# Patient Record
Sex: Female | Born: 1940 | Race: White | Hispanic: No
Health system: Southern US, Community
[De-identification: ages and names within clinical notes are randomized; demographics above are authoritative.]

---

## 1997-05-31 ENCOUNTER — Inpatient Hospital Stay (HOSPITAL_COMMUNITY): Admission: EM | Admit: 1997-05-31 | Discharge: 1997-06-04 | Payer: Self-pay | Admitting: Emergency Medicine

## 1997-09-03 ENCOUNTER — Other Ambulatory Visit: Admission: RE | Admit: 1997-09-03 | Discharge: 1997-09-03 | Payer: Self-pay

## 1997-12-07 ENCOUNTER — Other Ambulatory Visit: Admission: RE | Admit: 1997-12-07 | Discharge: 1997-12-07 | Payer: Self-pay

## 1999-01-03 ENCOUNTER — Ambulatory Visit (HOSPITAL_COMMUNITY): Admission: RE | Admit: 1999-01-03 | Discharge: 1999-01-03 | Payer: Self-pay | Admitting: Family Medicine

## 1999-01-25 ENCOUNTER — Ambulatory Visit (HOSPITAL_COMMUNITY): Admission: RE | Admit: 1999-01-25 | Discharge: 1999-01-25 | Payer: Self-pay | Admitting: Gastroenterology

## 1999-01-25 ENCOUNTER — Encounter: Payer: Self-pay | Admitting: Gastroenterology

## 1999-09-05 ENCOUNTER — Encounter: Payer: Self-pay | Admitting: Family Medicine

## 1999-09-05 ENCOUNTER — Encounter: Admission: RE | Admit: 1999-09-05 | Discharge: 1999-09-05 | Payer: Self-pay | Admitting: Family Medicine

## 1999-12-17 ENCOUNTER — Other Ambulatory Visit: Admission: RE | Admit: 1999-12-17 | Discharge: 1999-12-17 | Payer: Self-pay | Admitting: Family Medicine

## 1999-12-19 ENCOUNTER — Encounter: Admission: RE | Admit: 1999-12-19 | Discharge: 1999-12-19 | Payer: Self-pay | Admitting: Family Medicine

## 1999-12-19 ENCOUNTER — Encounter: Payer: Self-pay | Admitting: Family Medicine

## 2000-11-17 ENCOUNTER — Encounter: Admission: RE | Admit: 2000-11-17 | Discharge: 2000-11-17 | Payer: Self-pay | Admitting: Pediatrics

## 2000-11-17 ENCOUNTER — Encounter: Payer: Self-pay | Admitting: Family Medicine

## 2001-12-07 ENCOUNTER — Encounter: Admission: RE | Admit: 2001-12-07 | Discharge: 2001-12-07 | Payer: Self-pay | Admitting: Family Medicine

## 2001-12-07 ENCOUNTER — Encounter: Payer: Self-pay | Admitting: Family Medicine

## 2001-12-11 ENCOUNTER — Encounter: Payer: Self-pay | Admitting: Family Medicine

## 2001-12-11 ENCOUNTER — Encounter: Admission: RE | Admit: 2001-12-11 | Discharge: 2001-12-11 | Payer: Self-pay | Admitting: Family Medicine

## 2002-01-04 ENCOUNTER — Other Ambulatory Visit: Admission: RE | Admit: 2002-01-04 | Discharge: 2002-01-04 | Payer: Self-pay | Admitting: Family Medicine

## 2003-01-10 ENCOUNTER — Encounter: Admission: RE | Admit: 2003-01-10 | Discharge: 2003-01-10 | Payer: Self-pay | Admitting: Family Medicine

## 2003-03-07 ENCOUNTER — Other Ambulatory Visit: Admission: RE | Admit: 2003-03-07 | Discharge: 2003-03-07 | Payer: Self-pay | Admitting: Family Medicine

## 2004-02-22 ENCOUNTER — Encounter: Admission: RE | Admit: 2004-02-22 | Discharge: 2004-02-22 | Payer: Self-pay | Admitting: Family Medicine

## 2004-03-20 ENCOUNTER — Encounter (INDEPENDENT_AMBULATORY_CARE_PROVIDER_SITE_OTHER): Payer: Self-pay | Admitting: Cardiology

## 2004-03-20 ENCOUNTER — Ambulatory Visit (HOSPITAL_COMMUNITY): Admission: RE | Admit: 2004-03-20 | Discharge: 2004-03-20 | Payer: Self-pay | Admitting: Family Medicine

## 2004-05-22 ENCOUNTER — Ambulatory Visit (HOSPITAL_COMMUNITY): Admission: RE | Admit: 2004-05-22 | Discharge: 2004-05-22 | Payer: Self-pay | Admitting: Gastroenterology

## 2004-05-22 ENCOUNTER — Encounter (INDEPENDENT_AMBULATORY_CARE_PROVIDER_SITE_OTHER): Payer: Self-pay | Admitting: *Deleted

## 2005-02-28 ENCOUNTER — Encounter: Admission: RE | Admit: 2005-02-28 | Discharge: 2005-02-28 | Payer: Self-pay | Admitting: Family Medicine

## 2006-03-03 ENCOUNTER — Encounter: Admission: RE | Admit: 2006-03-03 | Discharge: 2006-03-03 | Payer: Self-pay | Admitting: Family Medicine

## 2007-03-06 ENCOUNTER — Encounter: Admission: RE | Admit: 2007-03-06 | Discharge: 2007-03-06 | Payer: Self-pay | Admitting: Family Medicine

## 2008-03-17 ENCOUNTER — Encounter: Admission: RE | Admit: 2008-03-17 | Discharge: 2008-03-17 | Payer: Self-pay | Admitting: Family Medicine

## 2009-03-21 ENCOUNTER — Encounter: Admission: RE | Admit: 2009-03-21 | Discharge: 2009-03-21 | Payer: Self-pay | Admitting: Family Medicine

## 2010-02-24 ENCOUNTER — Other Ambulatory Visit: Payer: Self-pay | Admitting: Family Medicine

## 2010-02-24 DIAGNOSIS — Z1239 Encounter for other screening for malignant neoplasm of breast: Secondary | ICD-10-CM

## 2010-03-23 ENCOUNTER — Ambulatory Visit
Admission: RE | Admit: 2010-03-23 | Discharge: 2010-03-23 | Disposition: A | Discharge status: Home / Self Care | Payer: MEDICARE | Source: Ambulatory Visit | Attending: Family Medicine | Admitting: Family Medicine

## 2010-03-23 DIAGNOSIS — Z1239 Encounter for other screening for malignant neoplasm of breast: Secondary | ICD-10-CM

## 2010-06-22 NOTE — Op Note (Signed)
NAMEJANIS, Dana Velasquez                ACCOUNT NO.:  [id number]   MEDICAL RECORD NO.:  [id number]          PATIENT TYPE:  AMB   LOCATION:  ENDO                         FACILITY:  [facility]   PHYSICIAN:  Petra Kuba, M.D.    DATE OF BIRTH:  11-21-1940   DATE OF PROCEDURE:  05/22/2004  DATE OF DISCHARGE:                                 OPERATIVE REPORT   PROCEDURE:  Colonoscopy.   INDICATIONS:  Bright red blood per rectum, probably due to hemorrhoids.  Patient due for colonic screening. Consent was signed after risks, benefits,  methods, options thoroughly discussed in the office.   MEDICINES USED:  Demerol 70, Versed 7   PROCEDURE:  Rectal inspection is pertinent for external hemorrhoids, small.  Digital exam was negative. Video pediatric adjustable colonoscope was  inserted, and despite some tortuosity, we were able to be advance to the  cecum. This did not require any abdominal pressure any position changes.  Left and right scattered diverticula were seen. No other abnormalities.  Cecum was identified by the appendiceal orifice and ileocecal valve. The  scope was inserted short ways in the terminal ileum which was normal. Photo  documentation was obtained. Scope was slowly withdrawn. On slow withdrawal  through the colon, the prep was adequate. There was some liquid stool that  required washing and suctioning. The scattered left and right diverticula  were confirmed. No other abnormalities were seen as we slowly withdrew back  to the rectum. In the rectum, a small rectal polyp was seen and snared,  electrocautery applied, suctioned through the scope and collected in the  trap. Anorectal pull-through and retroflexion confirmed some small  hemorrhoids. Scope was straightened re-advanced short ways up the left side  of the colon. Air was suctioned, scope removed. The patient tolerated the  procedure well. There was no obvious immediate complication.   ENDOSCOPIC DIAGNOSES:  1.   Internal-external small hemorrhoids.  2.  Small rectal polyp snared.  3.  Left and right scattered diverticula.  4.  Otherwise within normal limits to the terminal ileum.   PLAN:  Await pathology. Probably recheck in 5 years. Happy to see back  p.r.n. Otherwise return care to Dr. _______________ for customary health  care maintenance to include yearly rectals and guaiacs.      MEM/MEDQ  D:  05/22/2004  T:  05/22/2004  Job:  [id number]

## 2011-02-26 ENCOUNTER — Other Ambulatory Visit: Payer: Self-pay | Admitting: Family Medicine

## 2011-02-26 DIAGNOSIS — Z1231 Encounter for screening mammogram for malignant neoplasm of breast: Secondary | ICD-10-CM

## 2011-03-25 ENCOUNTER — Ambulatory Visit
Admission: RE | Admit: 2011-03-25 | Discharge: 2011-03-25 | Disposition: A | Discharge status: Home / Self Care | Payer: Medicare Other | Source: Ambulatory Visit | Attending: Family Medicine | Admitting: Family Medicine

## 2011-03-25 DIAGNOSIS — Z1231 Encounter for screening mammogram for malignant neoplasm of breast: Secondary | ICD-10-CM

## 2011-04-17 ENCOUNTER — Other Ambulatory Visit: Payer: Self-pay | Admitting: Family Medicine

## 2011-04-17 DIAGNOSIS — Z780 Asymptomatic menopausal state: Secondary | ICD-10-CM

## 2011-05-08 ENCOUNTER — Ambulatory Visit
Admission: RE | Admit: 2011-05-08 | Discharge: 2011-05-08 | Disposition: A | Discharge status: Home / Self Care | Payer: Medicare Other | Source: Ambulatory Visit | Attending: Family Medicine | Admitting: Family Medicine

## 2011-05-08 DIAGNOSIS — Z780 Asymptomatic menopausal state: Secondary | ICD-10-CM

## 2012-02-26 ENCOUNTER — Other Ambulatory Visit: Payer: Self-pay | Admitting: Family Medicine

## 2012-02-26 DIAGNOSIS — Z1231 Encounter for screening mammogram for malignant neoplasm of breast: Secondary | ICD-10-CM

## 2012-03-25 ENCOUNTER — Ambulatory Visit
Admission: RE | Admit: 2012-03-25 | Discharge: 2012-03-25 | Disposition: A | Discharge status: Home / Self Care | Payer: Medicare Other | Source: Ambulatory Visit | Attending: Family Medicine | Admitting: Family Medicine

## 2012-03-25 DIAGNOSIS — Z1231 Encounter for screening mammogram for malignant neoplasm of breast: Secondary | ICD-10-CM

## 2013-03-03 ENCOUNTER — Other Ambulatory Visit: Payer: Self-pay

## 2013-03-03 DIAGNOSIS — Z1231 Encounter for screening mammogram for malignant neoplasm of breast: Secondary | ICD-10-CM

## 2013-03-26 ENCOUNTER — Ambulatory Visit: Payer: Medicare Other

## 2013-04-14 ENCOUNTER — Ambulatory Visit
Admission: RE | Admit: 2013-04-14 | Discharge: 2013-04-14 | Disposition: A | Discharge status: Home / Self Care | Payer: Medicare Other | Source: Ambulatory Visit

## 2013-04-14 DIAGNOSIS — Z1231 Encounter for screening mammogram for malignant neoplasm of breast: Secondary | ICD-10-CM

## 2013-04-15 ENCOUNTER — Other Ambulatory Visit: Payer: Self-pay | Admitting: Family Medicine

## 2013-04-15 DIAGNOSIS — R928 Other abnormal and inconclusive findings on diagnostic imaging of breast: Secondary | ICD-10-CM

## 2013-04-19 ENCOUNTER — Other Ambulatory Visit: Payer: Self-pay | Admitting: Family Medicine

## 2013-04-19 DIAGNOSIS — M810 Age-related osteoporosis without current pathological fracture: Secondary | ICD-10-CM

## 2013-04-19 DIAGNOSIS — E2839 Other primary ovarian failure: Secondary | ICD-10-CM

## 2013-04-27 ENCOUNTER — Ambulatory Visit
Admission: RE | Admit: 2013-04-27 | Discharge: 2013-04-27 | Disposition: A | Discharge status: Home / Self Care | Payer: Medicare Other | Source: Ambulatory Visit | Attending: Family Medicine | Admitting: Family Medicine

## 2013-04-27 ENCOUNTER — Other Ambulatory Visit: Payer: Self-pay | Admitting: Family Medicine

## 2013-04-27 DIAGNOSIS — R928 Other abnormal and inconclusive findings on diagnostic imaging of breast: Secondary | ICD-10-CM

## 2013-05-10 ENCOUNTER — Other Ambulatory Visit: Payer: Medicare Other

## 2013-05-10 ENCOUNTER — Other Ambulatory Visit: Payer: Self-pay | Admitting: Family Medicine

## 2013-05-12 ENCOUNTER — Ambulatory Visit
Admission: RE | Admit: 2013-05-12 | Discharge: 2013-05-12 | Disposition: A | Discharge status: Home / Self Care | Payer: Medicare Other | Source: Ambulatory Visit | Attending: Family Medicine | Admitting: Family Medicine

## 2013-05-12 DIAGNOSIS — E2839 Other primary ovarian failure: Secondary | ICD-10-CM

## 2013-09-21 ENCOUNTER — Other Ambulatory Visit: Payer: Self-pay | Admitting: Family Medicine

## 2013-09-21 DIAGNOSIS — N6310 Unspecified lump in the right breast, unspecified quadrant: Secondary | ICD-10-CM

## 2013-09-30 ENCOUNTER — Ambulatory Visit
Admission: RE | Admit: 2013-09-30 | Discharge: 2013-09-30 | Disposition: A | Discharge status: Home / Self Care | Payer: Medicare Other | Source: Ambulatory Visit | Attending: Family Medicine | Admitting: Family Medicine

## 2013-09-30 DIAGNOSIS — N6310 Unspecified lump in the right breast, unspecified quadrant: Secondary | ICD-10-CM

## 2014-03-29 ENCOUNTER — Other Ambulatory Visit: Payer: Self-pay

## 2014-03-29 DIAGNOSIS — Z1231 Encounter for screening mammogram for malignant neoplasm of breast: Secondary | ICD-10-CM

## 2014-04-21 ENCOUNTER — Ambulatory Visit
Admission: RE | Admit: 2014-04-21 | Discharge: 2014-04-21 | Disposition: A | Discharge status: Home / Self Care | Payer: Medicare Other | Source: Ambulatory Visit

## 2014-04-21 DIAGNOSIS — Z1231 Encounter for screening mammogram for malignant neoplasm of breast: Secondary | ICD-10-CM

## 2014-04-27 DIAGNOSIS — E781 Pure hyperglyceridemia: Secondary | ICD-10-CM | POA: Diagnosis not present

## 2014-04-27 DIAGNOSIS — Z1389 Encounter for screening for other disorder: Secondary | ICD-10-CM | POA: Diagnosis not present

## 2014-04-27 DIAGNOSIS — Z9181 History of falling: Secondary | ICD-10-CM | POA: Diagnosis not present

## 2014-04-27 DIAGNOSIS — E039 Hypothyroidism, unspecified: Secondary | ICD-10-CM | POA: Diagnosis not present

## 2014-04-27 DIAGNOSIS — R7309 Other abnormal glucose: Secondary | ICD-10-CM | POA: Diagnosis not present

## 2014-04-27 DIAGNOSIS — Z0000 Encounter for general adult medical examination without abnormal findings: Secondary | ICD-10-CM | POA: Diagnosis not present

## 2014-11-01 DIAGNOSIS — E781 Pure hyperglyceridemia: Secondary | ICD-10-CM | POA: Diagnosis not present

## 2014-11-01 DIAGNOSIS — R7301 Impaired fasting glucose: Secondary | ICD-10-CM | POA: Diagnosis not present

## 2014-11-01 DIAGNOSIS — Z139 Encounter for screening, unspecified: Secondary | ICD-10-CM | POA: Diagnosis not present

## 2014-11-01 DIAGNOSIS — Z1389 Encounter for screening for other disorder: Secondary | ICD-10-CM | POA: Diagnosis not present

## 2014-11-01 DIAGNOSIS — Z9181 History of falling: Secondary | ICD-10-CM | POA: Diagnosis not present

## 2014-11-01 DIAGNOSIS — R7309 Other abnormal glucose: Secondary | ICD-10-CM | POA: Diagnosis not present

## 2014-11-01 DIAGNOSIS — E039 Hypothyroidism, unspecified: Secondary | ICD-10-CM | POA: Diagnosis not present

## 2014-11-01 DIAGNOSIS — B009 Herpesviral infection, unspecified: Secondary | ICD-10-CM | POA: Diagnosis not present

## 2014-11-09 ENCOUNTER — Other Ambulatory Visit: Payer: Self-pay | Admitting: Gastroenterology

## 2014-11-09 DIAGNOSIS — K5730 Diverticulosis of large intestine without perforation or abscess without bleeding: Secondary | ICD-10-CM | POA: Diagnosis not present

## 2014-11-09 DIAGNOSIS — D120 Benign neoplasm of cecum: Secondary | ICD-10-CM | POA: Diagnosis not present

## 2014-11-09 DIAGNOSIS — Z09 Encounter for follow-up examination after completed treatment for conditions other than malignant neoplasm: Secondary | ICD-10-CM | POA: Diagnosis not present

## 2014-11-09 DIAGNOSIS — D126 Benign neoplasm of colon, unspecified: Secondary | ICD-10-CM | POA: Diagnosis not present

## 2014-11-09 DIAGNOSIS — D122 Benign neoplasm of ascending colon: Secondary | ICD-10-CM | POA: Diagnosis not present

## 2014-11-09 DIAGNOSIS — D124 Benign neoplasm of descending colon: Secondary | ICD-10-CM | POA: Diagnosis not present

## 2014-11-09 DIAGNOSIS — Z86010 Personal history of colonic polyps: Secondary | ICD-10-CM | POA: Diagnosis not present

## 2014-11-29 DIAGNOSIS — Z23 Encounter for immunization: Secondary | ICD-10-CM | POA: Diagnosis not present

## 2014-12-07 DIAGNOSIS — H2513 Age-related nuclear cataract, bilateral: Secondary | ICD-10-CM | POA: Diagnosis not present

## 2014-12-07 DIAGNOSIS — H10413 Chronic giant papillary conjunctivitis, bilateral: Secondary | ICD-10-CM | POA: Diagnosis not present

## 2014-12-08 DIAGNOSIS — E039 Hypothyroidism, unspecified: Secondary | ICD-10-CM | POA: Diagnosis not present

## 2015-01-25 DIAGNOSIS — E039 Hypothyroidism, unspecified: Secondary | ICD-10-CM | POA: Diagnosis not present

## 2015-04-03 ENCOUNTER — Other Ambulatory Visit: Payer: Self-pay

## 2015-04-03 DIAGNOSIS — Z1231 Encounter for screening mammogram for malignant neoplasm of breast: Secondary | ICD-10-CM

## 2015-05-01 DIAGNOSIS — E781 Pure hyperglyceridemia: Secondary | ICD-10-CM | POA: Diagnosis not present

## 2015-05-01 DIAGNOSIS — R7303 Prediabetes: Secondary | ICD-10-CM | POA: Diagnosis not present

## 2015-05-01 DIAGNOSIS — E039 Hypothyroidism, unspecified: Secondary | ICD-10-CM | POA: Diagnosis not present

## 2015-05-01 DIAGNOSIS — I4891 Unspecified atrial fibrillation: Secondary | ICD-10-CM | POA: Diagnosis not present

## 2015-05-02 ENCOUNTER — Ambulatory Visit
Admission: RE | Admit: 2015-05-02 | Discharge: 2015-05-02 | Disposition: A | Discharge status: Home / Self Care | Payer: Medicare Other | Source: Ambulatory Visit

## 2015-05-02 DIAGNOSIS — Z1231 Encounter for screening mammogram for malignant neoplasm of breast: Secondary | ICD-10-CM | POA: Diagnosis not present

## 2015-06-27 DIAGNOSIS — M542 Cervicalgia: Secondary | ICD-10-CM | POA: Diagnosis not present

## 2015-06-27 DIAGNOSIS — J189 Pneumonia, unspecified organism: Secondary | ICD-10-CM | POA: Diagnosis not present

## 2015-11-10 DIAGNOSIS — Z23 Encounter for immunization: Secondary | ICD-10-CM | POA: Diagnosis not present

## 2015-11-10 DIAGNOSIS — E781 Pure hyperglyceridemia: Secondary | ICD-10-CM | POA: Diagnosis not present

## 2015-11-10 DIAGNOSIS — Z1389 Encounter for screening for other disorder: Secondary | ICD-10-CM | POA: Diagnosis not present

## 2015-11-10 DIAGNOSIS — R7303 Prediabetes: Secondary | ICD-10-CM | POA: Diagnosis not present

## 2015-11-10 DIAGNOSIS — I480 Paroxysmal atrial fibrillation: Secondary | ICD-10-CM | POA: Diagnosis not present

## 2015-11-10 DIAGNOSIS — E039 Hypothyroidism, unspecified: Secondary | ICD-10-CM | POA: Diagnosis not present

## 2015-11-10 DIAGNOSIS — Z79899 Other long term (current) drug therapy: Secondary | ICD-10-CM | POA: Diagnosis not present

## 2015-11-10 DIAGNOSIS — Z9181 History of falling: Secondary | ICD-10-CM | POA: Diagnosis not present

## 2016-05-15 ENCOUNTER — Other Ambulatory Visit: Payer: Self-pay | Admitting: Physician Assistant

## 2016-05-15 DIAGNOSIS — Z1231 Encounter for screening mammogram for malignant neoplasm of breast: Secondary | ICD-10-CM

## 2016-05-15 DIAGNOSIS — E2839 Other primary ovarian failure: Secondary | ICD-10-CM

## 2016-06-12 ENCOUNTER — Ambulatory Visit: Payer: Self-pay

## 2016-06-12 ENCOUNTER — Other Ambulatory Visit: Payer: Self-pay

## 2016-06-13 ENCOUNTER — Ambulatory Visit
Admission: RE | Admit: 2016-06-13 | Discharge: 2016-06-13 | Disposition: A | Discharge status: Home / Self Care | Payer: Medicare Other | Source: Ambulatory Visit | Attending: Physician Assistant | Admitting: Physician Assistant

## 2016-06-13 DIAGNOSIS — Z1231 Encounter for screening mammogram for malignant neoplasm of breast: Secondary | ICD-10-CM

## 2016-06-13 DIAGNOSIS — E2839 Other primary ovarian failure: Secondary | ICD-10-CM

## 2017-05-07 ENCOUNTER — Other Ambulatory Visit: Payer: Self-pay | Admitting: Physician Assistant

## 2017-05-07 DIAGNOSIS — Z1231 Encounter for screening mammogram for malignant neoplasm of breast: Secondary | ICD-10-CM

## 2017-06-10 DIAGNOSIS — G4733 Obstructive sleep apnea (adult) (pediatric): Secondary | ICD-10-CM | POA: Diagnosis not present

## 2017-06-10 DIAGNOSIS — R0602 Shortness of breath: Secondary | ICD-10-CM | POA: Diagnosis not present

## 2017-06-11 DIAGNOSIS — G4733 Obstructive sleep apnea (adult) (pediatric): Secondary | ICD-10-CM | POA: Diagnosis not present

## 2017-06-11 DIAGNOSIS — R0602 Shortness of breath: Secondary | ICD-10-CM | POA: Diagnosis not present

## 2017-06-18 ENCOUNTER — Ambulatory Visit
Admission: RE | Admit: 2017-06-18 | Discharge: 2017-06-18 | Disposition: A | Discharge status: Home / Self Care | Payer: Medicare Other | Source: Ambulatory Visit | Attending: Physician Assistant | Admitting: Physician Assistant

## 2017-06-18 DIAGNOSIS — Z1231 Encounter for screening mammogram for malignant neoplasm of breast: Secondary | ICD-10-CM | POA: Diagnosis not present

## 2017-07-30 DIAGNOSIS — G4733 Obstructive sleep apnea (adult) (pediatric): Secondary | ICD-10-CM | POA: Diagnosis not present

## 2017-08-29 DIAGNOSIS — G4733 Obstructive sleep apnea (adult) (pediatric): Secondary | ICD-10-CM | POA: Diagnosis not present

## 2017-09-29 DIAGNOSIS — G4733 Obstructive sleep apnea (adult) (pediatric): Secondary | ICD-10-CM | POA: Diagnosis not present

## 2017-10-02 DIAGNOSIS — Z9181 History of falling: Secondary | ICD-10-CM | POA: Diagnosis not present

## 2017-10-02 DIAGNOSIS — Z139 Encounter for screening, unspecified: Secondary | ICD-10-CM | POA: Diagnosis not present

## 2017-10-02 DIAGNOSIS — Z0000 Encounter for general adult medical examination without abnormal findings: Secondary | ICD-10-CM | POA: Diagnosis not present

## 2017-10-02 DIAGNOSIS — Z136 Encounter for screening for cardiovascular disorders: Secondary | ICD-10-CM | POA: Diagnosis not present

## 2017-10-30 DIAGNOSIS — G4733 Obstructive sleep apnea (adult) (pediatric): Secondary | ICD-10-CM | POA: Diagnosis not present

## 2017-11-19 DIAGNOSIS — R7303 Prediabetes: Secondary | ICD-10-CM | POA: Diagnosis not present

## 2017-11-19 DIAGNOSIS — E039 Hypothyroidism, unspecified: Secondary | ICD-10-CM | POA: Diagnosis not present

## 2017-11-19 DIAGNOSIS — Z79899 Other long term (current) drug therapy: Secondary | ICD-10-CM | POA: Diagnosis not present

## 2017-11-19 DIAGNOSIS — E781 Pure hyperglyceridemia: Secondary | ICD-10-CM | POA: Diagnosis not present

## 2017-11-19 DIAGNOSIS — Z23 Encounter for immunization: Secondary | ICD-10-CM | POA: Diagnosis not present

## 2017-11-29 DIAGNOSIS — G4733 Obstructive sleep apnea (adult) (pediatric): Secondary | ICD-10-CM | POA: Diagnosis not present

## 2017-12-18 DIAGNOSIS — H17822 Peripheral opacity of cornea, left eye: Secondary | ICD-10-CM | POA: Diagnosis not present

## 2017-12-18 DIAGNOSIS — H40011 Open angle with borderline findings, low risk, right eye: Secondary | ICD-10-CM | POA: Diagnosis not present

## 2017-12-18 DIAGNOSIS — H10413 Chronic giant papillary conjunctivitis, bilateral: Secondary | ICD-10-CM | POA: Diagnosis not present

## 2017-12-18 DIAGNOSIS — H2513 Age-related nuclear cataract, bilateral: Secondary | ICD-10-CM | POA: Diagnosis not present

## 2017-12-30 DIAGNOSIS — G4733 Obstructive sleep apnea (adult) (pediatric): Secondary | ICD-10-CM | POA: Diagnosis not present

## 2018-01-29 DIAGNOSIS — G4733 Obstructive sleep apnea (adult) (pediatric): Secondary | ICD-10-CM | POA: Diagnosis not present

## 2018-03-01 DIAGNOSIS — G4733 Obstructive sleep apnea (adult) (pediatric): Secondary | ICD-10-CM | POA: Diagnosis not present

## 2018-03-10 DIAGNOSIS — G4733 Obstructive sleep apnea (adult) (pediatric): Secondary | ICD-10-CM | POA: Diagnosis not present

## 2018-04-01 DIAGNOSIS — G4733 Obstructive sleep apnea (adult) (pediatric): Secondary | ICD-10-CM | POA: Diagnosis not present

## 2018-07-08 ENCOUNTER — Other Ambulatory Visit: Payer: Self-pay | Admitting: Physician Assistant

## 2018-07-08 DIAGNOSIS — Z1231 Encounter for screening mammogram for malignant neoplasm of breast: Secondary | ICD-10-CM

## 2018-08-25 ENCOUNTER — Ambulatory Visit
Admission: RE | Admit: 2018-08-25 | Discharge: 2018-08-25 | Disposition: A | Discharge status: Home / Self Care | Payer: Medicare Other | Source: Ambulatory Visit | Attending: Physician Assistant | Admitting: Physician Assistant

## 2018-08-25 ENCOUNTER — Other Ambulatory Visit: Payer: Self-pay

## 2018-08-25 DIAGNOSIS — Z1231 Encounter for screening mammogram for malignant neoplasm of breast: Secondary | ICD-10-CM

## 2019-07-23 ENCOUNTER — Other Ambulatory Visit: Payer: Self-pay | Admitting: Physician Assistant

## 2019-07-23 DIAGNOSIS — Z1231 Encounter for screening mammogram for malignant neoplasm of breast: Secondary | ICD-10-CM

## 2019-08-05 DIAGNOSIS — G4733 Obstructive sleep apnea (adult) (pediatric): Secondary | ICD-10-CM | POA: Diagnosis not present

## 2019-08-05 DIAGNOSIS — I4821 Permanent atrial fibrillation: Secondary | ICD-10-CM | POA: Diagnosis not present

## 2019-08-05 DIAGNOSIS — R5383 Other fatigue: Secondary | ICD-10-CM | POA: Diagnosis not present

## 2019-08-10 DIAGNOSIS — E039 Hypothyroidism, unspecified: Secondary | ICD-10-CM | POA: Diagnosis not present

## 2019-08-10 DIAGNOSIS — R7303 Prediabetes: Secondary | ICD-10-CM | POA: Diagnosis not present

## 2019-08-10 DIAGNOSIS — E781 Pure hyperglyceridemia: Secondary | ICD-10-CM | POA: Diagnosis not present

## 2019-08-10 DIAGNOSIS — I480 Paroxysmal atrial fibrillation: Secondary | ICD-10-CM | POA: Diagnosis not present

## 2019-08-10 DIAGNOSIS — Z79899 Other long term (current) drug therapy: Secondary | ICD-10-CM | POA: Diagnosis not present

## 2019-08-26 ENCOUNTER — Ambulatory Visit
Admission: RE | Admit: 2019-08-26 | Discharge: 2019-08-26 | Disposition: A | Discharge status: Home / Self Care | Payer: Medicare Other | Source: Ambulatory Visit | Attending: Physician Assistant | Admitting: Physician Assistant

## 2019-08-26 ENCOUNTER — Other Ambulatory Visit: Payer: Self-pay

## 2019-08-26 DIAGNOSIS — Z1231 Encounter for screening mammogram for malignant neoplasm of breast: Secondary | ICD-10-CM | POA: Diagnosis not present

## 2019-10-20 DIAGNOSIS — Z7901 Long term (current) use of anticoagulants: Secondary | ICD-10-CM | POA: Diagnosis not present

## 2019-10-20 DIAGNOSIS — I341 Nonrheumatic mitral (valve) prolapse: Secondary | ICD-10-CM | POA: Diagnosis not present

## 2019-10-20 DIAGNOSIS — I340 Nonrheumatic mitral (valve) insufficiency: Secondary | ICD-10-CM | POA: Diagnosis not present

## 2019-10-20 DIAGNOSIS — I493 Ventricular premature depolarization: Secondary | ICD-10-CM | POA: Diagnosis not present

## 2019-10-20 DIAGNOSIS — I4821 Permanent atrial fibrillation: Secondary | ICD-10-CM | POA: Diagnosis not present

## 2019-10-29 DIAGNOSIS — G4733 Obstructive sleep apnea (adult) (pediatric): Secondary | ICD-10-CM | POA: Diagnosis not present

## 2019-11-25 DIAGNOSIS — I4821 Permanent atrial fibrillation: Secondary | ICD-10-CM | POA: Diagnosis not present

## 2019-11-25 DIAGNOSIS — G4733 Obstructive sleep apnea (adult) (pediatric): Secondary | ICD-10-CM | POA: Diagnosis not present

## 2019-11-25 DIAGNOSIS — R0602 Shortness of breath: Secondary | ICD-10-CM | POA: Diagnosis not present

## 2019-11-25 DIAGNOSIS — R5383 Other fatigue: Secondary | ICD-10-CM | POA: Diagnosis not present

## 2019-12-14 DIAGNOSIS — Z79899 Other long term (current) drug therapy: Secondary | ICD-10-CM | POA: Diagnosis not present

## 2019-12-14 DIAGNOSIS — I480 Paroxysmal atrial fibrillation: Secondary | ICD-10-CM | POA: Diagnosis not present

## 2019-12-14 DIAGNOSIS — E039 Hypothyroidism, unspecified: Secondary | ICD-10-CM | POA: Diagnosis not present

## 2019-12-14 DIAGNOSIS — R7303 Prediabetes: Secondary | ICD-10-CM | POA: Diagnosis not present

## 2019-12-14 DIAGNOSIS — Z139 Encounter for screening, unspecified: Secondary | ICD-10-CM | POA: Diagnosis not present

## 2019-12-14 DIAGNOSIS — Z9181 History of falling: Secondary | ICD-10-CM | POA: Diagnosis not present

## 2019-12-14 DIAGNOSIS — E781 Pure hyperglyceridemia: Secondary | ICD-10-CM | POA: Diagnosis not present

## 2020-01-06 DIAGNOSIS — H2513 Age-related nuclear cataract, bilateral: Secondary | ICD-10-CM | POA: Diagnosis not present

## 2020-01-06 DIAGNOSIS — R7309 Other abnormal glucose: Secondary | ICD-10-CM | POA: Diagnosis not present

## 2020-01-06 DIAGNOSIS — H179 Unspecified corneal scar and opacity: Secondary | ICD-10-CM | POA: Diagnosis not present

## 2020-01-06 DIAGNOSIS — H40011 Open angle with borderline findings, low risk, right eye: Secondary | ICD-10-CM | POA: Diagnosis not present

## 2020-01-20 DIAGNOSIS — G4733 Obstructive sleep apnea (adult) (pediatric): Secondary | ICD-10-CM | POA: Diagnosis not present

## 2020-01-20 DIAGNOSIS — R5383 Other fatigue: Secondary | ICD-10-CM | POA: Diagnosis not present

## 2020-01-20 DIAGNOSIS — I4821 Permanent atrial fibrillation: Secondary | ICD-10-CM | POA: Diagnosis not present

## 2020-02-02 DIAGNOSIS — G4733 Obstructive sleep apnea (adult) (pediatric): Secondary | ICD-10-CM | POA: Diagnosis not present

## 2020-03-16 DIAGNOSIS — Z0000 Encounter for general adult medical examination without abnormal findings: Secondary | ICD-10-CM | POA: Diagnosis not present

## 2020-03-16 DIAGNOSIS — Z9181 History of falling: Secondary | ICD-10-CM | POA: Diagnosis not present

## 2020-04-14 DIAGNOSIS — E781 Pure hyperglyceridemia: Secondary | ICD-10-CM | POA: Diagnosis not present

## 2020-04-14 DIAGNOSIS — Z79899 Other long term (current) drug therapy: Secondary | ICD-10-CM | POA: Diagnosis not present

## 2020-04-14 DIAGNOSIS — R202 Paresthesia of skin: Secondary | ICD-10-CM | POA: Diagnosis not present

## 2020-04-14 DIAGNOSIS — I480 Paroxysmal atrial fibrillation: Secondary | ICD-10-CM | POA: Diagnosis not present

## 2020-04-14 DIAGNOSIS — E039 Hypothyroidism, unspecified: Secondary | ICD-10-CM | POA: Diagnosis not present

## 2020-04-14 DIAGNOSIS — G4733 Obstructive sleep apnea (adult) (pediatric): Secondary | ICD-10-CM | POA: Diagnosis not present

## 2020-04-14 DIAGNOSIS — R7303 Prediabetes: Secondary | ICD-10-CM | POA: Diagnosis not present

## 2020-04-20 DIAGNOSIS — H02886 Meibomian gland dysfunction of left eye, unspecified eyelid: Secondary | ICD-10-CM | POA: Diagnosis not present

## 2020-04-20 DIAGNOSIS — H02883 Meibomian gland dysfunction of right eye, unspecified eyelid: Secondary | ICD-10-CM | POA: Diagnosis not present

## 2020-04-21 DIAGNOSIS — I272 Pulmonary hypertension, unspecified: Secondary | ICD-10-CM | POA: Diagnosis not present

## 2020-04-21 DIAGNOSIS — Z9989 Dependence on other enabling machines and devices: Secondary | ICD-10-CM | POA: Diagnosis not present

## 2020-04-21 DIAGNOSIS — I4821 Permanent atrial fibrillation: Secondary | ICD-10-CM | POA: Diagnosis not present

## 2020-04-21 DIAGNOSIS — Z7901 Long term (current) use of anticoagulants: Secondary | ICD-10-CM | POA: Diagnosis not present

## 2020-04-21 DIAGNOSIS — G4733 Obstructive sleep apnea (adult) (pediatric): Secondary | ICD-10-CM | POA: Diagnosis not present

## 2020-05-04 DIAGNOSIS — G4733 Obstructive sleep apnea (adult) (pediatric): Secondary | ICD-10-CM | POA: Diagnosis not present

## 2020-05-22 DIAGNOSIS — H02883 Meibomian gland dysfunction of right eye, unspecified eyelid: Secondary | ICD-10-CM | POA: Diagnosis not present

## 2020-05-22 DIAGNOSIS — H02886 Meibomian gland dysfunction of left eye, unspecified eyelid: Secondary | ICD-10-CM | POA: Diagnosis not present

## 2020-06-15 DIAGNOSIS — K5730 Diverticulosis of large intestine without perforation or abscess without bleeding: Secondary | ICD-10-CM | POA: Diagnosis not present

## 2020-07-17 DIAGNOSIS — E118 Type 2 diabetes mellitus with unspecified complications: Secondary | ICD-10-CM | POA: Diagnosis not present

## 2020-07-17 DIAGNOSIS — E781 Pure hyperglyceridemia: Secondary | ICD-10-CM | POA: Diagnosis not present

## 2020-07-17 DIAGNOSIS — I480 Paroxysmal atrial fibrillation: Secondary | ICD-10-CM | POA: Diagnosis not present

## 2020-07-17 DIAGNOSIS — Z79899 Other long term (current) drug therapy: Secondary | ICD-10-CM | POA: Diagnosis not present

## 2020-07-17 DIAGNOSIS — E039 Hypothyroidism, unspecified: Secondary | ICD-10-CM | POA: Diagnosis not present

## 2020-07-17 DIAGNOSIS — G4733 Obstructive sleep apnea (adult) (pediatric): Secondary | ICD-10-CM | POA: Diagnosis not present

## 2020-07-18 ENCOUNTER — Other Ambulatory Visit: Payer: Self-pay | Admitting: Physician Assistant

## 2020-07-18 DIAGNOSIS — E2839 Other primary ovarian failure: Secondary | ICD-10-CM

## 2020-07-18 DIAGNOSIS — Z1231 Encounter for screening mammogram for malignant neoplasm of breast: Secondary | ICD-10-CM

## 2020-08-03 DIAGNOSIS — I4821 Permanent atrial fibrillation: Secondary | ICD-10-CM | POA: Diagnosis not present

## 2020-08-03 DIAGNOSIS — R5383 Other fatigue: Secondary | ICD-10-CM | POA: Diagnosis not present

## 2020-08-03 DIAGNOSIS — G4733 Obstructive sleep apnea (adult) (pediatric): Secondary | ICD-10-CM | POA: Diagnosis not present

## 2020-08-15 DIAGNOSIS — G4733 Obstructive sleep apnea (adult) (pediatric): Secondary | ICD-10-CM | POA: Diagnosis not present

## 2020-09-11 ENCOUNTER — Ambulatory Visit
Admission: RE | Admit: 2020-09-11 | Discharge: 2020-09-11 | Disposition: A | Discharge status: Home / Self Care | Payer: Medicare Other | Source: Ambulatory Visit | Attending: Physician Assistant | Admitting: Physician Assistant

## 2020-09-11 ENCOUNTER — Other Ambulatory Visit: Payer: Self-pay

## 2020-09-11 DIAGNOSIS — Z1231 Encounter for screening mammogram for malignant neoplasm of breast: Secondary | ICD-10-CM | POA: Diagnosis not present

## 2020-11-20 DIAGNOSIS — G4733 Obstructive sleep apnea (adult) (pediatric): Secondary | ICD-10-CM | POA: Diagnosis not present

## 2020-11-20 DIAGNOSIS — E118 Type 2 diabetes mellitus with unspecified complications: Secondary | ICD-10-CM | POA: Diagnosis not present

## 2020-11-20 DIAGNOSIS — E781 Pure hyperglyceridemia: Secondary | ICD-10-CM | POA: Diagnosis not present

## 2020-11-20 DIAGNOSIS — Z79899 Other long term (current) drug therapy: Secondary | ICD-10-CM | POA: Diagnosis not present

## 2020-11-20 DIAGNOSIS — Z23 Encounter for immunization: Secondary | ICD-10-CM | POA: Diagnosis not present

## 2020-11-20 DIAGNOSIS — I480 Paroxysmal atrial fibrillation: Secondary | ICD-10-CM | POA: Diagnosis not present

## 2020-11-20 DIAGNOSIS — E039 Hypothyroidism, unspecified: Secondary | ICD-10-CM | POA: Diagnosis not present

## 2020-11-22 DIAGNOSIS — I340 Nonrheumatic mitral (valve) insufficiency: Secondary | ICD-10-CM | POA: Diagnosis not present

## 2020-11-22 DIAGNOSIS — Z7901 Long term (current) use of anticoagulants: Secondary | ICD-10-CM | POA: Diagnosis not present

## 2020-11-22 DIAGNOSIS — G4733 Obstructive sleep apnea (adult) (pediatric): Secondary | ICD-10-CM | POA: Diagnosis not present

## 2020-11-22 DIAGNOSIS — I341 Nonrheumatic mitral (valve) prolapse: Secondary | ICD-10-CM | POA: Diagnosis not present

## 2020-11-22 DIAGNOSIS — I493 Ventricular premature depolarization: Secondary | ICD-10-CM | POA: Diagnosis not present

## 2020-11-22 DIAGNOSIS — Z9989 Dependence on other enabling machines and devices: Secondary | ICD-10-CM | POA: Diagnosis not present

## 2020-11-22 DIAGNOSIS — I4821 Permanent atrial fibrillation: Secondary | ICD-10-CM | POA: Diagnosis not present

## 2020-11-24 DIAGNOSIS — G4733 Obstructive sleep apnea (adult) (pediatric): Secondary | ICD-10-CM | POA: Diagnosis not present

## 2020-12-12 DIAGNOSIS — L603 Nail dystrophy: Secondary | ICD-10-CM | POA: Diagnosis not present

## 2020-12-12 DIAGNOSIS — I7091 Generalized atherosclerosis: Secondary | ICD-10-CM | POA: Diagnosis not present

## 2020-12-12 DIAGNOSIS — B351 Tinea unguium: Secondary | ICD-10-CM | POA: Diagnosis not present

## 2020-12-22 DIAGNOSIS — I272 Pulmonary hypertension, unspecified: Secondary | ICD-10-CM | POA: Diagnosis not present

## 2020-12-22 DIAGNOSIS — I081 Rheumatic disorders of both mitral and tricuspid valves: Secondary | ICD-10-CM | POA: Diagnosis not present

## 2021-01-03 ENCOUNTER — Other Ambulatory Visit: Payer: Medicare Other

## 2021-01-08 DIAGNOSIS — H2513 Age-related nuclear cataract, bilateral: Secondary | ICD-10-CM | POA: Diagnosis not present

## 2021-01-08 DIAGNOSIS — H02886 Meibomian gland dysfunction of left eye, unspecified eyelid: Secondary | ICD-10-CM | POA: Diagnosis not present

## 2021-01-08 DIAGNOSIS — H02883 Meibomian gland dysfunction of right eye, unspecified eyelid: Secondary | ICD-10-CM | POA: Diagnosis not present

## 2021-01-08 DIAGNOSIS — H40011 Open angle with borderline findings, low risk, right eye: Secondary | ICD-10-CM | POA: Diagnosis not present

## 2021-02-27 DIAGNOSIS — G4733 Obstructive sleep apnea (adult) (pediatric): Secondary | ICD-10-CM | POA: Diagnosis not present

## 2021-03-22 DIAGNOSIS — Z9181 History of falling: Secondary | ICD-10-CM | POA: Diagnosis not present

## 2021-03-22 DIAGNOSIS — E785 Hyperlipidemia, unspecified: Secondary | ICD-10-CM | POA: Diagnosis not present

## 2021-03-22 DIAGNOSIS — Z139 Encounter for screening, unspecified: Secondary | ICD-10-CM | POA: Diagnosis not present

## 2021-03-22 DIAGNOSIS — Z0000 Encounter for general adult medical examination without abnormal findings: Secondary | ICD-10-CM | POA: Diagnosis not present

## 2021-03-26 DIAGNOSIS — E781 Pure hyperglyceridemia: Secondary | ICD-10-CM | POA: Diagnosis not present

## 2021-03-26 DIAGNOSIS — E039 Hypothyroidism, unspecified: Secondary | ICD-10-CM | POA: Diagnosis not present

## 2021-03-26 DIAGNOSIS — E118 Type 2 diabetes mellitus with unspecified complications: Secondary | ICD-10-CM | POA: Diagnosis not present

## 2021-03-26 DIAGNOSIS — I480 Paroxysmal atrial fibrillation: Secondary | ICD-10-CM | POA: Diagnosis not present

## 2021-03-26 DIAGNOSIS — Z79899 Other long term (current) drug therapy: Secondary | ICD-10-CM | POA: Diagnosis not present

## 2021-03-26 DIAGNOSIS — G4733 Obstructive sleep apnea (adult) (pediatric): Secondary | ICD-10-CM | POA: Diagnosis not present

## 2021-04-24 DIAGNOSIS — L603 Nail dystrophy: Secondary | ICD-10-CM | POA: Diagnosis not present

## 2021-04-24 DIAGNOSIS — B351 Tinea unguium: Secondary | ICD-10-CM | POA: Diagnosis not present

## 2021-04-24 DIAGNOSIS — I7091 Generalized atherosclerosis: Secondary | ICD-10-CM | POA: Diagnosis not present

## 2021-05-23 DIAGNOSIS — I340 Nonrheumatic mitral (valve) insufficiency: Secondary | ICD-10-CM | POA: Diagnosis not present

## 2021-05-23 DIAGNOSIS — I4821 Permanent atrial fibrillation: Secondary | ICD-10-CM | POA: Diagnosis not present

## 2021-05-23 DIAGNOSIS — R0609 Other forms of dyspnea: Secondary | ICD-10-CM | POA: Diagnosis not present

## 2021-05-23 DIAGNOSIS — I493 Ventricular premature depolarization: Secondary | ICD-10-CM | POA: Diagnosis not present

## 2021-05-23 DIAGNOSIS — R5381 Other malaise: Secondary | ICD-10-CM | POA: Diagnosis not present

## 2021-05-23 DIAGNOSIS — R5383 Other fatigue: Secondary | ICD-10-CM | POA: Diagnosis not present

## 2021-05-23 DIAGNOSIS — Z9989 Dependence on other enabling machines and devices: Secondary | ICD-10-CM | POA: Diagnosis not present

## 2021-05-23 DIAGNOSIS — Z7901 Long term (current) use of anticoagulants: Secondary | ICD-10-CM | POA: Diagnosis not present

## 2021-05-23 DIAGNOSIS — I341 Nonrheumatic mitral (valve) prolapse: Secondary | ICD-10-CM | POA: Diagnosis not present

## 2021-05-23 DIAGNOSIS — G4733 Obstructive sleep apnea (adult) (pediatric): Secondary | ICD-10-CM | POA: Diagnosis not present

## 2021-05-30 ENCOUNTER — Other Ambulatory Visit: Payer: Medicare Other

## 2021-06-04 ENCOUNTER — Ambulatory Visit
Admission: RE | Admit: 2021-06-04 | Discharge: 2021-06-04 | Disposition: A | Discharge status: Home / Self Care | Payer: Medicare Other | Source: Ambulatory Visit | Attending: Physician Assistant | Admitting: Physician Assistant

## 2021-06-04 DIAGNOSIS — M85852 Other specified disorders of bone density and structure, left thigh: Secondary | ICD-10-CM | POA: Diagnosis not present

## 2021-06-04 DIAGNOSIS — E2839 Other primary ovarian failure: Secondary | ICD-10-CM

## 2021-06-04 DIAGNOSIS — Z780 Asymptomatic menopausal state: Secondary | ICD-10-CM | POA: Diagnosis not present

## 2021-06-08 DIAGNOSIS — G4733 Obstructive sleep apnea (adult) (pediatric): Secondary | ICD-10-CM | POA: Diagnosis not present

## 2021-06-18 DIAGNOSIS — I4821 Permanent atrial fibrillation: Secondary | ICD-10-CM | POA: Diagnosis not present

## 2021-06-18 DIAGNOSIS — I493 Ventricular premature depolarization: Secondary | ICD-10-CM | POA: Diagnosis not present

## 2021-06-19 DIAGNOSIS — I4891 Unspecified atrial fibrillation: Secondary | ICD-10-CM | POA: Diagnosis not present

## 2021-06-25 DIAGNOSIS — L603 Nail dystrophy: Secondary | ICD-10-CM | POA: Diagnosis not present

## 2021-06-25 DIAGNOSIS — B351 Tinea unguium: Secondary | ICD-10-CM | POA: Diagnosis not present

## 2021-06-25 DIAGNOSIS — I7091 Generalized atherosclerosis: Secondary | ICD-10-CM | POA: Diagnosis not present

## 2021-07-27 DIAGNOSIS — G4733 Obstructive sleep apnea (adult) (pediatric): Secondary | ICD-10-CM | POA: Diagnosis not present

## 2021-07-27 DIAGNOSIS — I340 Nonrheumatic mitral (valve) insufficiency: Secondary | ICD-10-CM | POA: Diagnosis not present

## 2021-07-27 DIAGNOSIS — I493 Ventricular premature depolarization: Secondary | ICD-10-CM | POA: Diagnosis not present

## 2021-07-27 DIAGNOSIS — R0602 Shortness of breath: Secondary | ICD-10-CM | POA: Diagnosis not present

## 2021-07-27 DIAGNOSIS — R5381 Other malaise: Secondary | ICD-10-CM | POA: Diagnosis not present

## 2021-07-27 DIAGNOSIS — E038 Other specified hypothyroidism: Secondary | ICD-10-CM | POA: Diagnosis not present

## 2021-07-27 DIAGNOSIS — I4821 Permanent atrial fibrillation: Secondary | ICD-10-CM | POA: Diagnosis not present

## 2021-07-27 DIAGNOSIS — Z9989 Dependence on other enabling machines and devices: Secondary | ICD-10-CM | POA: Diagnosis not present

## 2021-07-27 DIAGNOSIS — R0609 Other forms of dyspnea: Secondary | ICD-10-CM | POA: Diagnosis not present

## 2021-07-27 DIAGNOSIS — I272 Pulmonary hypertension, unspecified: Secondary | ICD-10-CM | POA: Diagnosis not present

## 2021-07-27 DIAGNOSIS — Z7901 Long term (current) use of anticoagulants: Secondary | ICD-10-CM | POA: Diagnosis not present

## 2021-07-27 DIAGNOSIS — R5383 Other fatigue: Secondary | ICD-10-CM | POA: Diagnosis not present

## 2021-07-28 DIAGNOSIS — J069 Acute upper respiratory infection, unspecified: Secondary | ICD-10-CM | POA: Diagnosis not present

## 2021-07-28 DIAGNOSIS — I493 Ventricular premature depolarization: Secondary | ICD-10-CM | POA: Diagnosis not present

## 2021-07-28 DIAGNOSIS — I4821 Permanent atrial fibrillation: Secondary | ICD-10-CM | POA: Diagnosis not present

## 2021-07-28 DIAGNOSIS — R059 Cough, unspecified: Secondary | ICD-10-CM | POA: Diagnosis not present

## 2021-07-31 DIAGNOSIS — Z79899 Other long term (current) drug therapy: Secondary | ICD-10-CM | POA: Diagnosis not present

## 2021-07-31 DIAGNOSIS — E039 Hypothyroidism, unspecified: Secondary | ICD-10-CM | POA: Diagnosis not present

## 2021-07-31 DIAGNOSIS — E118 Type 2 diabetes mellitus with unspecified complications: Secondary | ICD-10-CM | POA: Diagnosis not present

## 2021-07-31 DIAGNOSIS — I480 Paroxysmal atrial fibrillation: Secondary | ICD-10-CM | POA: Diagnosis not present

## 2021-07-31 DIAGNOSIS — G4733 Obstructive sleep apnea (adult) (pediatric): Secondary | ICD-10-CM | POA: Diagnosis not present

## 2021-07-31 DIAGNOSIS — R059 Cough, unspecified: Secondary | ICD-10-CM | POA: Diagnosis not present

## 2021-07-31 DIAGNOSIS — E781 Pure hyperglyceridemia: Secondary | ICD-10-CM | POA: Diagnosis not present

## 2021-08-10 DIAGNOSIS — M65311 Trigger thumb, right thumb: Secondary | ICD-10-CM | POA: Diagnosis not present

## 2021-08-23 DIAGNOSIS — R5383 Other fatigue: Secondary | ICD-10-CM | POA: Diagnosis not present

## 2021-08-23 DIAGNOSIS — G4733 Obstructive sleep apnea (adult) (pediatric): Secondary | ICD-10-CM | POA: Diagnosis not present

## 2021-08-23 DIAGNOSIS — I4821 Permanent atrial fibrillation: Secondary | ICD-10-CM | POA: Diagnosis not present

## 2021-08-27 DIAGNOSIS — I7091 Generalized atherosclerosis: Secondary | ICD-10-CM | POA: Diagnosis not present

## 2021-08-27 DIAGNOSIS — B351 Tinea unguium: Secondary | ICD-10-CM | POA: Diagnosis not present

## 2021-08-27 DIAGNOSIS — L603 Nail dystrophy: Secondary | ICD-10-CM | POA: Diagnosis not present

## 2021-09-05 DIAGNOSIS — I5030 Unspecified diastolic (congestive) heart failure: Secondary | ICD-10-CM | POA: Diagnosis not present

## 2021-09-05 DIAGNOSIS — Z950 Presence of cardiac pacemaker: Secondary | ICD-10-CM | POA: Diagnosis not present

## 2021-09-05 DIAGNOSIS — I4821 Permanent atrial fibrillation: Secondary | ICD-10-CM | POA: Diagnosis not present

## 2021-09-05 DIAGNOSIS — Z7901 Long term (current) use of anticoagulants: Secondary | ICD-10-CM | POA: Diagnosis not present

## 2021-09-05 DIAGNOSIS — I493 Ventricular premature depolarization: Secondary | ICD-10-CM | POA: Diagnosis not present

## 2021-09-05 DIAGNOSIS — E039 Hypothyroidism, unspecified: Secondary | ICD-10-CM | POA: Diagnosis not present

## 2021-09-05 DIAGNOSIS — Z79899 Other long term (current) drug therapy: Secondary | ICD-10-CM | POA: Diagnosis not present

## 2021-09-05 DIAGNOSIS — I517 Cardiomegaly: Secondary | ICD-10-CM | POA: Diagnosis not present

## 2021-09-05 DIAGNOSIS — G4733 Obstructive sleep apnea (adult) (pediatric): Secondary | ICD-10-CM | POA: Diagnosis not present

## 2021-09-05 DIAGNOSIS — I495 Sick sinus syndrome: Secondary | ICD-10-CM | POA: Diagnosis not present

## 2021-09-05 DIAGNOSIS — I700 Atherosclerosis of aorta: Secondary | ICD-10-CM | POA: Diagnosis not present

## 2021-09-06 DIAGNOSIS — Z79899 Other long term (current) drug therapy: Secondary | ICD-10-CM | POA: Diagnosis not present

## 2021-09-06 DIAGNOSIS — I5030 Unspecified diastolic (congestive) heart failure: Secondary | ICD-10-CM | POA: Diagnosis not present

## 2021-09-06 DIAGNOSIS — I493 Ventricular premature depolarization: Secondary | ICD-10-CM | POA: Diagnosis not present

## 2021-09-06 DIAGNOSIS — G4733 Obstructive sleep apnea (adult) (pediatric): Secondary | ICD-10-CM | POA: Diagnosis not present

## 2021-09-06 DIAGNOSIS — Z7901 Long term (current) use of anticoagulants: Secondary | ICD-10-CM | POA: Diagnosis not present

## 2021-09-06 DIAGNOSIS — E039 Hypothyroidism, unspecified: Secondary | ICD-10-CM | POA: Diagnosis not present

## 2021-09-06 DIAGNOSIS — I4821 Permanent atrial fibrillation: Secondary | ICD-10-CM | POA: Diagnosis not present

## 2021-09-11 DIAGNOSIS — G4733 Obstructive sleep apnea (adult) (pediatric): Secondary | ICD-10-CM | POA: Diagnosis not present

## 2021-09-19 DIAGNOSIS — R001 Bradycardia, unspecified: Secondary | ICD-10-CM | POA: Diagnosis not present

## 2021-09-20 DIAGNOSIS — I454 Nonspecific intraventricular block: Secondary | ICD-10-CM | POA: Diagnosis not present

## 2021-09-20 DIAGNOSIS — I4891 Unspecified atrial fibrillation: Secondary | ICD-10-CM | POA: Diagnosis not present

## 2021-10-04 DIAGNOSIS — R5383 Other fatigue: Secondary | ICD-10-CM | POA: Diagnosis not present

## 2021-10-04 DIAGNOSIS — I4821 Permanent atrial fibrillation: Secondary | ICD-10-CM | POA: Diagnosis not present

## 2021-10-04 DIAGNOSIS — G4733 Obstructive sleep apnea (adult) (pediatric): Secondary | ICD-10-CM | POA: Diagnosis not present

## 2021-10-29 DIAGNOSIS — B351 Tinea unguium: Secondary | ICD-10-CM | POA: Diagnosis not present

## 2021-10-29 DIAGNOSIS — I7091 Generalized atherosclerosis: Secondary | ICD-10-CM | POA: Diagnosis not present

## 2021-10-29 DIAGNOSIS — L603 Nail dystrophy: Secondary | ICD-10-CM | POA: Diagnosis not present

## 2021-11-04 IMAGING — MG MM DIGITAL SCREENING BILAT W/ TOMO AND CAD
6 of 10 series · 6 of 30 positions shown · non-contrast
Comparison: Previous exam(s).

CLINICAL DATA: Screening.

EXAM:
DIGITAL SCREENING BILATERAL MAMMOGRAM WITH TOMOSYNTHESIS AND CAD
TECHNIQUE: Bilateral screening digital craniocaudal and mediolateral oblique
mammograms were obtained. Bilateral screening digital breast
tomosynthesis was performed. The images were evaluated with
computer-aided detection.

[R CC synth-2D]
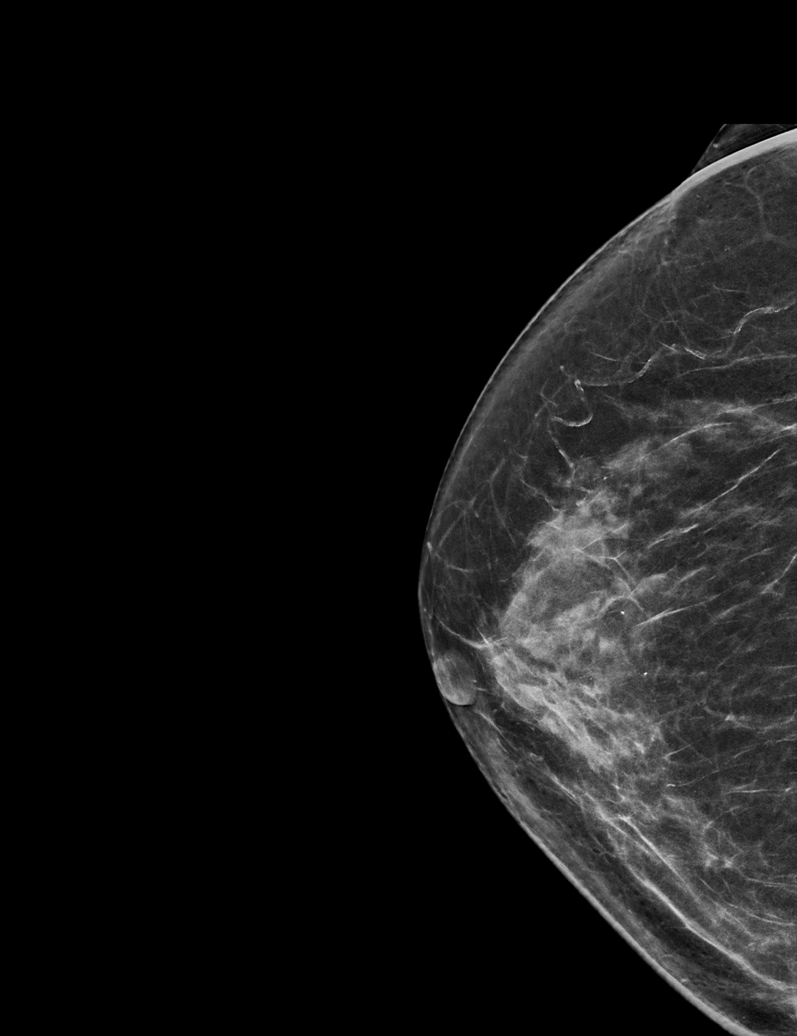

[R MLO synth-2D]
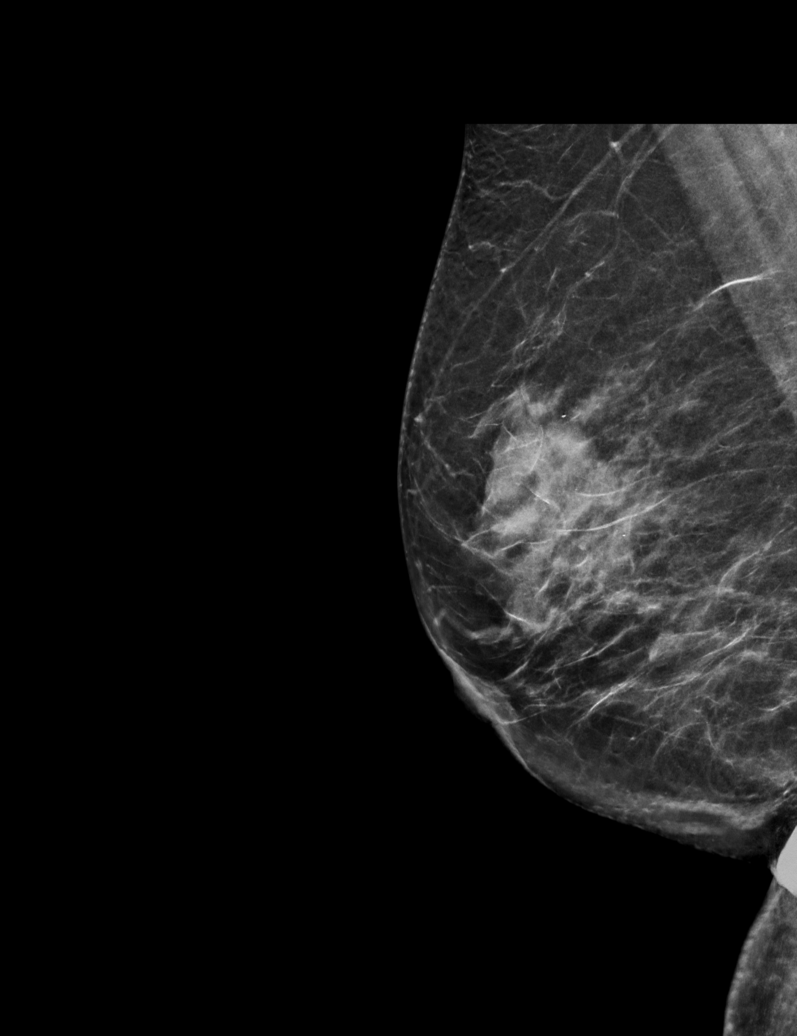

[R CV synth-2D]
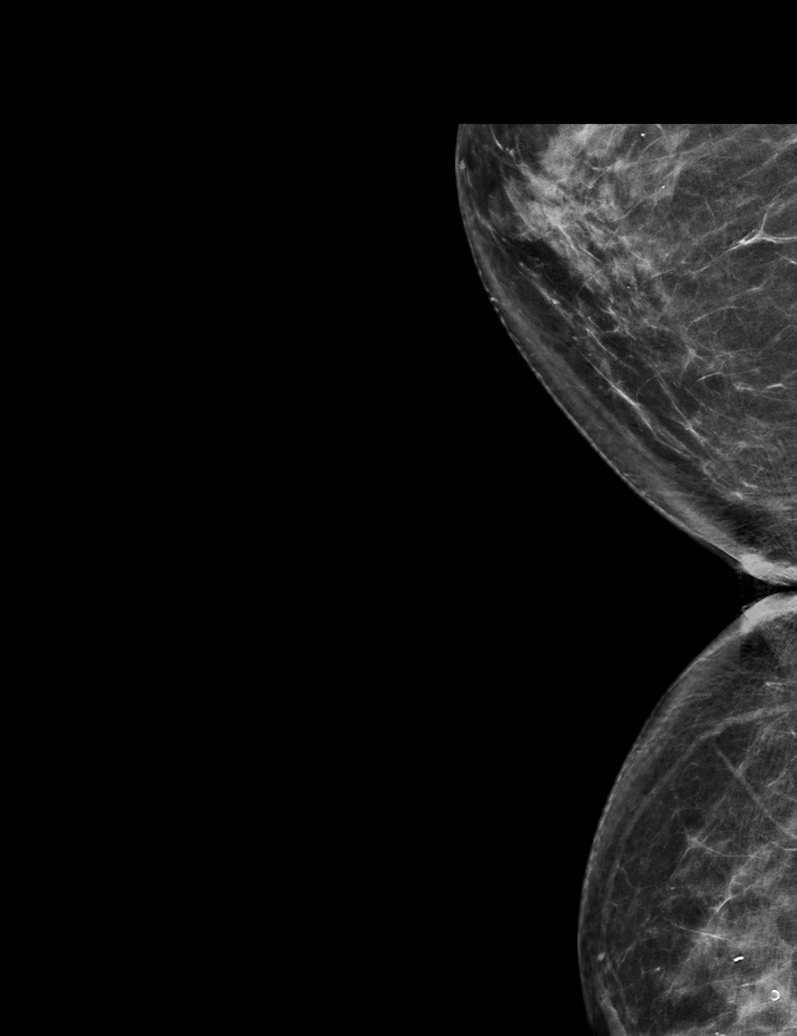

[L MLO synth-2D]
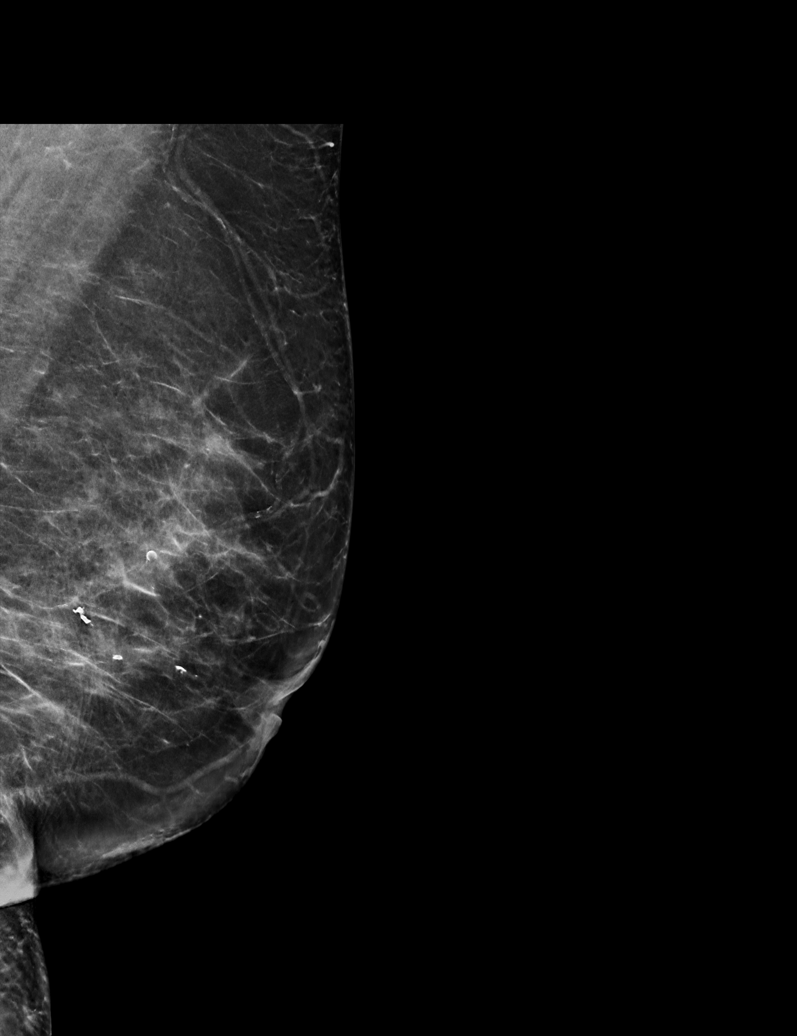

[L CC synth-2D]
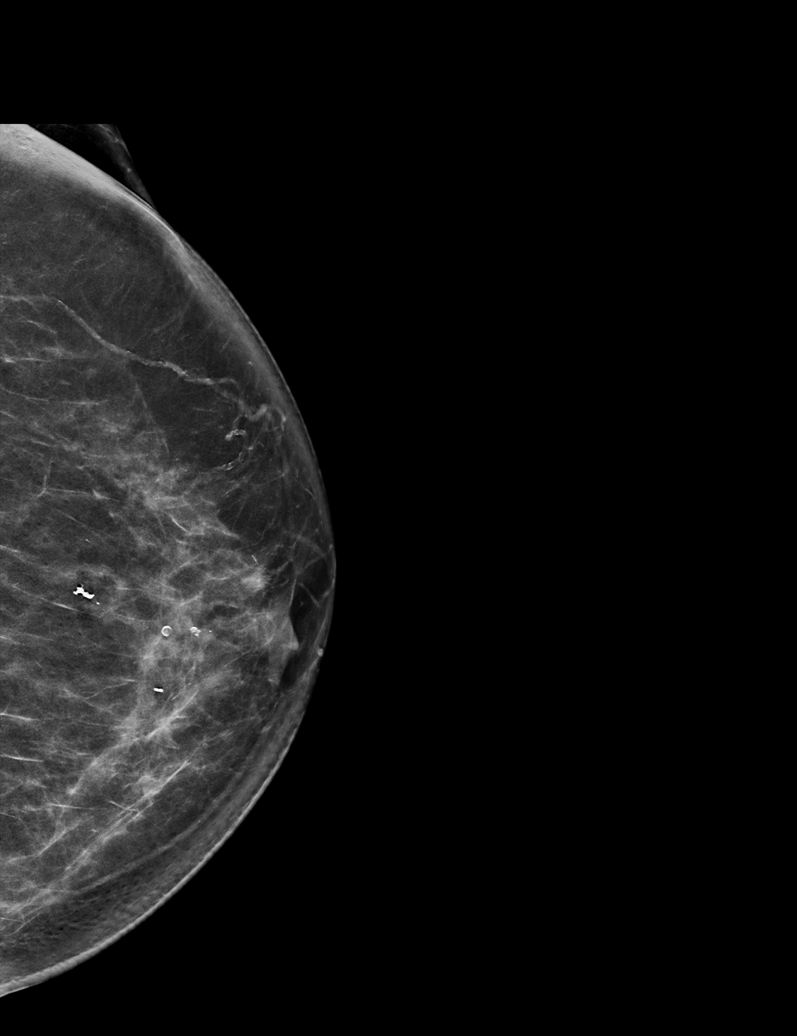

[L CC tomo · tomo slice 35/68.0]
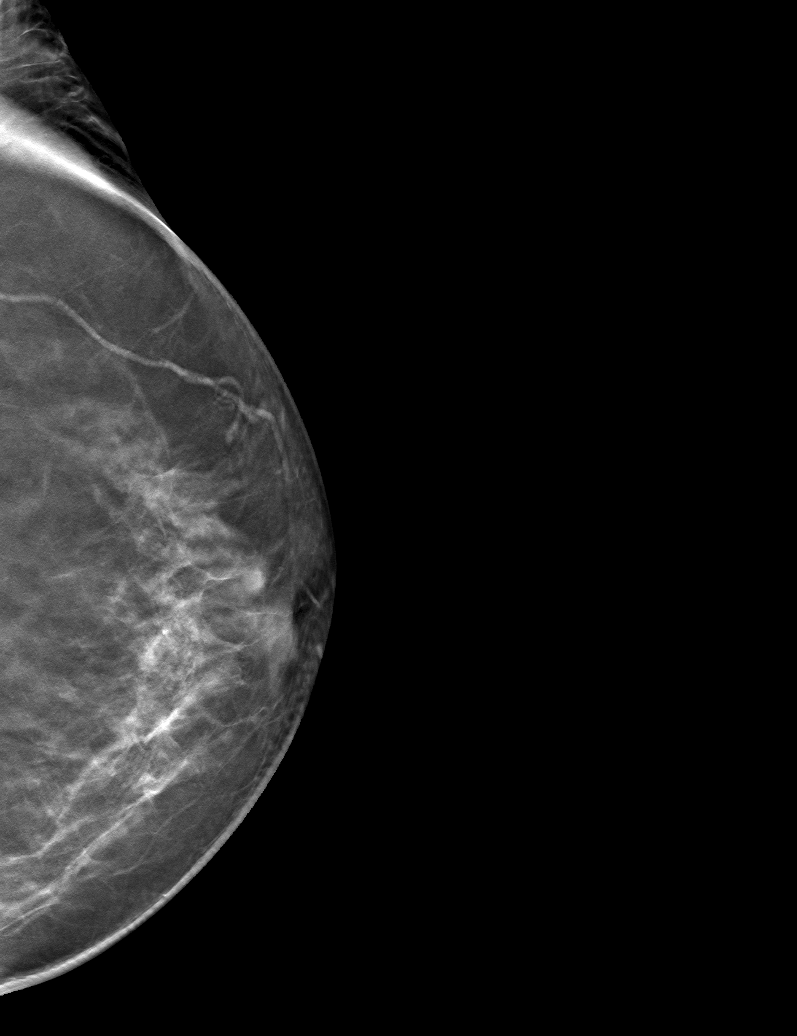

[6 of 30 positions shown; findings below may reference images not displayed]

ACR Breast Density Category c: The breast tissue is heterogeneously
dense, which may obscure small masses.
FINDINGS: There are no findings suspicious for malignancy.
IMPRESSION: No mammographic evidence of malignancy. A result letter of this
screening mammogram will be mailed directly to the patient.

RECOMMENDATION:
Screening mammogram in one year. (Code:Q3-W-BC3)

BI-RADS CATEGORY  1: Negative.

## 2021-11-27 DIAGNOSIS — I447 Left bundle-branch block, unspecified: Secondary | ICD-10-CM | POA: Diagnosis not present

## 2021-11-27 DIAGNOSIS — I272 Pulmonary hypertension, unspecified: Secondary | ICD-10-CM | POA: Diagnosis not present

## 2021-11-27 DIAGNOSIS — I341 Nonrheumatic mitral (valve) prolapse: Secondary | ICD-10-CM | POA: Diagnosis not present

## 2021-11-27 DIAGNOSIS — I340 Nonrheumatic mitral (valve) insufficiency: Secondary | ICD-10-CM | POA: Diagnosis not present

## 2021-11-27 DIAGNOSIS — Z950 Presence of cardiac pacemaker: Secondary | ICD-10-CM | POA: Diagnosis not present

## 2021-11-27 DIAGNOSIS — I493 Ventricular premature depolarization: Secondary | ICD-10-CM | POA: Diagnosis not present

## 2021-11-27 DIAGNOSIS — Z7901 Long term (current) use of anticoagulants: Secondary | ICD-10-CM | POA: Diagnosis not present

## 2021-11-27 DIAGNOSIS — I4821 Permanent atrial fibrillation: Secondary | ICD-10-CM | POA: Diagnosis not present

## 2021-11-30 DIAGNOSIS — E118 Type 2 diabetes mellitus with unspecified complications: Secondary | ICD-10-CM | POA: Diagnosis not present

## 2021-11-30 DIAGNOSIS — E039 Hypothyroidism, unspecified: Secondary | ICD-10-CM | POA: Diagnosis not present

## 2021-11-30 DIAGNOSIS — I480 Paroxysmal atrial fibrillation: Secondary | ICD-10-CM | POA: Diagnosis not present

## 2021-11-30 DIAGNOSIS — G4733 Obstructive sleep apnea (adult) (pediatric): Secondary | ICD-10-CM | POA: Diagnosis not present

## 2021-11-30 DIAGNOSIS — Z79899 Other long term (current) drug therapy: Secondary | ICD-10-CM | POA: Diagnosis not present

## 2021-11-30 DIAGNOSIS — E782 Mixed hyperlipidemia: Secondary | ICD-10-CM | POA: Diagnosis not present

## 2021-11-30 DIAGNOSIS — Z23 Encounter for immunization: Secondary | ICD-10-CM | POA: Diagnosis not present

## 2021-12-12 DIAGNOSIS — G4733 Obstructive sleep apnea (adult) (pediatric): Secondary | ICD-10-CM | POA: Diagnosis not present

## 2021-12-14 DIAGNOSIS — I272 Pulmonary hypertension, unspecified: Secondary | ICD-10-CM | POA: Diagnosis not present

## 2021-12-14 DIAGNOSIS — I081 Rheumatic disorders of both mitral and tricuspid valves: Secondary | ICD-10-CM | POA: Diagnosis not present

## 2021-12-23 DIAGNOSIS — I4891 Unspecified atrial fibrillation: Secondary | ICD-10-CM | POA: Diagnosis not present

## 2021-12-23 DIAGNOSIS — Z45018 Encounter for adjustment and management of other part of cardiac pacemaker: Secondary | ICD-10-CM | POA: Diagnosis not present

## 2021-12-31 DIAGNOSIS — I7091 Generalized atherosclerosis: Secondary | ICD-10-CM | POA: Diagnosis not present

## 2021-12-31 DIAGNOSIS — B351 Tinea unguium: Secondary | ICD-10-CM | POA: Diagnosis not present

## 2021-12-31 DIAGNOSIS — L603 Nail dystrophy: Secondary | ICD-10-CM | POA: Diagnosis not present

## 2022-01-10 DIAGNOSIS — H40011 Open angle with borderline findings, low risk, right eye: Secondary | ICD-10-CM | POA: Diagnosis not present

## 2022-01-10 DIAGNOSIS — H2513 Age-related nuclear cataract, bilateral: Secondary | ICD-10-CM | POA: Diagnosis not present

## 2022-01-10 DIAGNOSIS — H02883 Meibomian gland dysfunction of right eye, unspecified eyelid: Secondary | ICD-10-CM | POA: Diagnosis not present

## 2022-01-10 DIAGNOSIS — H02886 Meibomian gland dysfunction of left eye, unspecified eyelid: Secondary | ICD-10-CM | POA: Diagnosis not present

## 2022-02-27 DIAGNOSIS — Z950 Presence of cardiac pacemaker: Secondary | ICD-10-CM | POA: Diagnosis not present

## 2022-02-27 DIAGNOSIS — Z7901 Long term (current) use of anticoagulants: Secondary | ICD-10-CM | POA: Diagnosis not present

## 2022-02-27 DIAGNOSIS — I4891 Unspecified atrial fibrillation: Secondary | ICD-10-CM | POA: Diagnosis not present

## 2022-02-27 DIAGNOSIS — M79622 Pain in left upper arm: Secondary | ICD-10-CM | POA: Diagnosis not present

## 2022-02-27 DIAGNOSIS — I493 Ventricular premature depolarization: Secondary | ICD-10-CM | POA: Diagnosis not present

## 2022-02-28 DIAGNOSIS — I493 Ventricular premature depolarization: Secondary | ICD-10-CM | POA: Diagnosis not present

## 2022-03-04 DIAGNOSIS — B351 Tinea unguium: Secondary | ICD-10-CM | POA: Diagnosis not present

## 2022-03-04 DIAGNOSIS — I7091 Generalized atherosclerosis: Secondary | ICD-10-CM | POA: Diagnosis not present

## 2022-03-04 DIAGNOSIS — L603 Nail dystrophy: Secondary | ICD-10-CM | POA: Diagnosis not present

## 2022-03-06 DIAGNOSIS — Z45010 Encounter for checking and testing of cardiac pacemaker pulse generator [battery]: Secondary | ICD-10-CM | POA: Diagnosis not present

## 2022-03-06 DIAGNOSIS — I4891 Unspecified atrial fibrillation: Secondary | ICD-10-CM | POA: Diagnosis not present

## 2022-03-20 DIAGNOSIS — G4733 Obstructive sleep apnea (adult) (pediatric): Secondary | ICD-10-CM | POA: Diagnosis not present

## 2022-03-22 DIAGNOSIS — G4733 Obstructive sleep apnea (adult) (pediatric): Secondary | ICD-10-CM | POA: Diagnosis not present

## 2022-04-03 DIAGNOSIS — G4733 Obstructive sleep apnea (adult) (pediatric): Secondary | ICD-10-CM | POA: Diagnosis not present

## 2022-04-03 DIAGNOSIS — E1169 Type 2 diabetes mellitus with other specified complication: Secondary | ICD-10-CM | POA: Diagnosis not present

## 2022-04-03 DIAGNOSIS — M25512 Pain in left shoulder: Secondary | ICD-10-CM | POA: Diagnosis not present

## 2022-04-03 DIAGNOSIS — E782 Mixed hyperlipidemia: Secondary | ICD-10-CM | POA: Diagnosis not present

## 2022-04-03 DIAGNOSIS — E118 Type 2 diabetes mellitus with unspecified complications: Secondary | ICD-10-CM | POA: Diagnosis not present

## 2022-04-03 DIAGNOSIS — Z79899 Other long term (current) drug therapy: Secondary | ICD-10-CM | POA: Diagnosis not present

## 2022-04-03 DIAGNOSIS — E039 Hypothyroidism, unspecified: Secondary | ICD-10-CM | POA: Diagnosis not present

## 2022-04-03 DIAGNOSIS — I480 Paroxysmal atrial fibrillation: Secondary | ICD-10-CM | POA: Diagnosis not present

## 2022-04-05 DIAGNOSIS — G4733 Obstructive sleep apnea (adult) (pediatric): Secondary | ICD-10-CM | POA: Diagnosis not present

## 2022-04-08 DIAGNOSIS — Z9181 History of falling: Secondary | ICD-10-CM | POA: Diagnosis not present

## 2022-04-08 DIAGNOSIS — Z139 Encounter for screening, unspecified: Secondary | ICD-10-CM | POA: Diagnosis not present

## 2022-04-08 DIAGNOSIS — M25512 Pain in left shoulder: Secondary | ICD-10-CM | POA: Diagnosis not present

## 2022-06-03 DIAGNOSIS — L603 Nail dystrophy: Secondary | ICD-10-CM | POA: Diagnosis not present

## 2022-06-03 DIAGNOSIS — B351 Tinea unguium: Secondary | ICD-10-CM | POA: Diagnosis not present

## 2022-06-03 DIAGNOSIS — I7091 Generalized atherosclerosis: Secondary | ICD-10-CM | POA: Diagnosis not present

## 2022-06-05 DIAGNOSIS — I4821 Permanent atrial fibrillation: Secondary | ICD-10-CM | POA: Diagnosis not present

## 2022-06-28 DIAGNOSIS — I4891 Unspecified atrial fibrillation: Secondary | ICD-10-CM | POA: Diagnosis not present

## 2022-06-28 DIAGNOSIS — I493 Ventricular premature depolarization: Secondary | ICD-10-CM | POA: Diagnosis not present

## 2022-06-28 DIAGNOSIS — I341 Nonrheumatic mitral (valve) prolapse: Secondary | ICD-10-CM | POA: Diagnosis not present

## 2022-06-28 DIAGNOSIS — Z79899 Other long term (current) drug therapy: Secondary | ICD-10-CM | POA: Diagnosis not present

## 2022-06-28 DIAGNOSIS — Z7901 Long term (current) use of anticoagulants: Secondary | ICD-10-CM | POA: Diagnosis not present

## 2022-06-28 DIAGNOSIS — I447 Left bundle-branch block, unspecified: Secondary | ICD-10-CM | POA: Diagnosis not present

## 2022-06-29 DIAGNOSIS — I493 Ventricular premature depolarization: Secondary | ICD-10-CM | POA: Diagnosis not present

## 2022-06-29 DIAGNOSIS — I4891 Unspecified atrial fibrillation: Secondary | ICD-10-CM | POA: Diagnosis not present

## 2022-07-01 DIAGNOSIS — M25562 Pain in left knee: Secondary | ICD-10-CM | POA: Diagnosis not present

## 2022-07-10 DIAGNOSIS — M25562 Pain in left knee: Secondary | ICD-10-CM | POA: Diagnosis not present

## 2022-07-11 DIAGNOSIS — I4821 Permanent atrial fibrillation: Secondary | ICD-10-CM | POA: Diagnosis not present

## 2022-07-11 DIAGNOSIS — G4733 Obstructive sleep apnea (adult) (pediatric): Secondary | ICD-10-CM | POA: Diagnosis not present

## 2022-07-11 DIAGNOSIS — R5383 Other fatigue: Secondary | ICD-10-CM | POA: Diagnosis not present

## 2022-07-25 DIAGNOSIS — G4733 Obstructive sleep apnea (adult) (pediatric): Secondary | ICD-10-CM | POA: Diagnosis not present

## 2022-08-02 DIAGNOSIS — Z79899 Other long term (current) drug therapy: Secondary | ICD-10-CM | POA: Diagnosis not present

## 2022-08-02 DIAGNOSIS — I480 Paroxysmal atrial fibrillation: Secondary | ICD-10-CM | POA: Diagnosis not present

## 2022-08-02 DIAGNOSIS — E1169 Type 2 diabetes mellitus with other specified complication: Secondary | ICD-10-CM | POA: Diagnosis not present

## 2022-08-02 DIAGNOSIS — G4733 Obstructive sleep apnea (adult) (pediatric): Secondary | ICD-10-CM | POA: Diagnosis not present

## 2022-08-02 DIAGNOSIS — N390 Urinary tract infection, site not specified: Secondary | ICD-10-CM | POA: Diagnosis not present

## 2022-08-02 DIAGNOSIS — E039 Hypothyroidism, unspecified: Secondary | ICD-10-CM | POA: Diagnosis not present

## 2022-08-02 DIAGNOSIS — E782 Mixed hyperlipidemia: Secondary | ICD-10-CM | POA: Diagnosis not present

## 2022-08-09 DIAGNOSIS — A491 Streptococcal infection, unspecified site: Secondary | ICD-10-CM | POA: Diagnosis not present

## 2022-08-20 DIAGNOSIS — H40011 Open angle with borderline findings, low risk, right eye: Secondary | ICD-10-CM | POA: Diagnosis not present

## 2022-08-20 DIAGNOSIS — H02883 Meibomian gland dysfunction of right eye, unspecified eyelid: Secondary | ICD-10-CM | POA: Diagnosis not present

## 2022-08-20 DIAGNOSIS — H2512 Age-related nuclear cataract, left eye: Secondary | ICD-10-CM | POA: Diagnosis not present

## 2022-08-20 DIAGNOSIS — H02886 Meibomian gland dysfunction of left eye, unspecified eyelid: Secondary | ICD-10-CM | POA: Diagnosis not present

## 2022-08-21 DIAGNOSIS — N390 Urinary tract infection, site not specified: Secondary | ICD-10-CM | POA: Diagnosis not present

## 2022-08-26 DIAGNOSIS — I7091 Generalized atherosclerosis: Secondary | ICD-10-CM | POA: Diagnosis not present

## 2022-08-26 DIAGNOSIS — L603 Nail dystrophy: Secondary | ICD-10-CM | POA: Diagnosis not present

## 2022-08-26 DIAGNOSIS — B351 Tinea unguium: Secondary | ICD-10-CM | POA: Diagnosis not present

## 2022-08-29 DIAGNOSIS — N390 Urinary tract infection, site not specified: Secondary | ICD-10-CM | POA: Diagnosis not present

## 2022-08-30 DIAGNOSIS — H25812 Combined forms of age-related cataract, left eye: Secondary | ICD-10-CM | POA: Diagnosis not present

## 2022-09-13 DIAGNOSIS — Z9989 Dependence on other enabling machines and devices: Secondary | ICD-10-CM | POA: Diagnosis not present

## 2022-09-13 DIAGNOSIS — G4733 Obstructive sleep apnea (adult) (pediatric): Secondary | ICD-10-CM | POA: Diagnosis not present

## 2022-09-13 DIAGNOSIS — Z950 Presence of cardiac pacemaker: Secondary | ICD-10-CM | POA: Diagnosis not present

## 2022-09-13 DIAGNOSIS — I4821 Permanent atrial fibrillation: Secondary | ICD-10-CM | POA: Diagnosis not present

## 2022-09-13 DIAGNOSIS — I495 Sick sinus syndrome: Secondary | ICD-10-CM | POA: Diagnosis not present

## 2022-09-13 DIAGNOSIS — I272 Pulmonary hypertension, unspecified: Secondary | ICD-10-CM | POA: Diagnosis not present

## 2022-09-13 DIAGNOSIS — I493 Ventricular premature depolarization: Secondary | ICD-10-CM | POA: Diagnosis not present

## 2022-09-13 DIAGNOSIS — I4729 Other ventricular tachycardia: Secondary | ICD-10-CM | POA: Diagnosis not present

## 2022-09-13 DIAGNOSIS — Z7901 Long term (current) use of anticoagulants: Secondary | ICD-10-CM | POA: Diagnosis not present

## 2022-09-22 DIAGNOSIS — Z45018 Encounter for adjustment and management of other part of cardiac pacemaker: Secondary | ICD-10-CM | POA: Diagnosis not present

## 2022-09-24 DIAGNOSIS — H2511 Age-related nuclear cataract, right eye: Secondary | ICD-10-CM | POA: Diagnosis not present

## 2022-09-27 DIAGNOSIS — H25811 Combined forms of age-related cataract, right eye: Secondary | ICD-10-CM | POA: Diagnosis not present

## 2022-10-23 DIAGNOSIS — M1712 Unilateral primary osteoarthritis, left knee: Secondary | ICD-10-CM | POA: Diagnosis not present

## 2022-11-25 DIAGNOSIS — B351 Tinea unguium: Secondary | ICD-10-CM | POA: Diagnosis not present

## 2022-11-25 DIAGNOSIS — L603 Nail dystrophy: Secondary | ICD-10-CM | POA: Diagnosis not present

## 2022-11-25 DIAGNOSIS — I7091 Generalized atherosclerosis: Secondary | ICD-10-CM | POA: Diagnosis not present

## 2022-12-09 DIAGNOSIS — L989 Disorder of the skin and subcutaneous tissue, unspecified: Secondary | ICD-10-CM | POA: Diagnosis not present

## 2022-12-09 DIAGNOSIS — Z79899 Other long term (current) drug therapy: Secondary | ICD-10-CM | POA: Diagnosis not present

## 2022-12-09 DIAGNOSIS — E039 Hypothyroidism, unspecified: Secondary | ICD-10-CM | POA: Diagnosis not present

## 2022-12-09 DIAGNOSIS — G4733 Obstructive sleep apnea (adult) (pediatric): Secondary | ICD-10-CM | POA: Diagnosis not present

## 2022-12-09 DIAGNOSIS — E782 Mixed hyperlipidemia: Secondary | ICD-10-CM | POA: Diagnosis not present

## 2022-12-09 DIAGNOSIS — E1169 Type 2 diabetes mellitus with other specified complication: Secondary | ICD-10-CM | POA: Diagnosis not present

## 2022-12-09 DIAGNOSIS — I480 Paroxysmal atrial fibrillation: Secondary | ICD-10-CM | POA: Diagnosis not present

## 2022-12-09 DIAGNOSIS — Z23 Encounter for immunization: Secondary | ICD-10-CM | POA: Diagnosis not present

## 2022-12-10 DIAGNOSIS — Z0000 Encounter for general adult medical examination without abnormal findings: Secondary | ICD-10-CM | POA: Diagnosis not present

## 2022-12-10 DIAGNOSIS — Z139 Encounter for screening, unspecified: Secondary | ICD-10-CM | POA: Diagnosis not present

## 2022-12-10 DIAGNOSIS — Z9181 History of falling: Secondary | ICD-10-CM | POA: Diagnosis not present

## 2022-12-18 DIAGNOSIS — I083 Combined rheumatic disorders of mitral, aortic and tricuspid valves: Secondary | ICD-10-CM | POA: Diagnosis not present

## 2022-12-18 DIAGNOSIS — I272 Pulmonary hypertension, unspecified: Secondary | ICD-10-CM | POA: Diagnosis not present

## 2022-12-19 DIAGNOSIS — D2239 Melanocytic nevi of other parts of face: Secondary | ICD-10-CM | POA: Diagnosis not present

## 2022-12-19 DIAGNOSIS — L821 Other seborrheic keratosis: Secondary | ICD-10-CM | POA: Diagnosis not present

## 2022-12-19 DIAGNOSIS — D485 Neoplasm of uncertain behavior of skin: Secondary | ICD-10-CM | POA: Diagnosis not present

## 2022-12-30 DIAGNOSIS — I493 Ventricular premature depolarization: Secondary | ICD-10-CM | POA: Diagnosis not present

## 2022-12-30 DIAGNOSIS — Z950 Presence of cardiac pacemaker: Secondary | ICD-10-CM | POA: Diagnosis not present

## 2022-12-30 DIAGNOSIS — I341 Nonrheumatic mitral (valve) prolapse: Secondary | ICD-10-CM | POA: Diagnosis not present

## 2022-12-30 DIAGNOSIS — I4821 Permanent atrial fibrillation: Secondary | ICD-10-CM | POA: Diagnosis not present

## 2022-12-30 DIAGNOSIS — I340 Nonrheumatic mitral (valve) insufficiency: Secondary | ICD-10-CM | POA: Diagnosis not present

## 2022-12-30 DIAGNOSIS — Z7901 Long term (current) use of anticoagulants: Secondary | ICD-10-CM | POA: Diagnosis not present

## 2022-12-30 DIAGNOSIS — I495 Sick sinus syndrome: Secondary | ICD-10-CM | POA: Diagnosis not present

## 2023-01-25 DIAGNOSIS — J218 Acute bronchiolitis due to other specified organisms: Secondary | ICD-10-CM | POA: Diagnosis not present

## 2023-01-25 DIAGNOSIS — R059 Cough, unspecified: Secondary | ICD-10-CM | POA: Diagnosis not present

## 2023-01-25 DIAGNOSIS — R0981 Nasal congestion: Secondary | ICD-10-CM | POA: Diagnosis not present

## 2023-02-07 DIAGNOSIS — C441191 Basal cell carcinoma of skin of left upper eyelid, including canthus: Secondary | ICD-10-CM | POA: Diagnosis not present

## 2023-02-21 DIAGNOSIS — B351 Tinea unguium: Secondary | ICD-10-CM | POA: Diagnosis not present

## 2023-02-21 DIAGNOSIS — L603 Nail dystrophy: Secondary | ICD-10-CM | POA: Diagnosis not present

## 2023-02-21 DIAGNOSIS — R0981 Nasal congestion: Secondary | ICD-10-CM | POA: Diagnosis not present

## 2023-02-21 DIAGNOSIS — I7091 Generalized atherosclerosis: Secondary | ICD-10-CM | POA: Diagnosis not present

## 2023-02-21 DIAGNOSIS — R0782 Intercostal pain: Secondary | ICD-10-CM | POA: Diagnosis not present

## 2023-02-21 DIAGNOSIS — R051 Acute cough: Secondary | ICD-10-CM | POA: Diagnosis not present

## 2023-03-02 DIAGNOSIS — Z45010 Encounter for checking and testing of cardiac pacemaker pulse generator [battery]: Secondary | ICD-10-CM | POA: Diagnosis not present

## 2023-03-09 DIAGNOSIS — Z45010 Encounter for checking and testing of cardiac pacemaker pulse generator [battery]: Secondary | ICD-10-CM | POA: Diagnosis not present

## 2023-04-28 DIAGNOSIS — I480 Paroxysmal atrial fibrillation: Secondary | ICD-10-CM | POA: Diagnosis not present

## 2023-04-28 DIAGNOSIS — E1169 Type 2 diabetes mellitus with other specified complication: Secondary | ICD-10-CM | POA: Diagnosis not present

## 2023-04-28 DIAGNOSIS — Z79899 Other long term (current) drug therapy: Secondary | ICD-10-CM | POA: Diagnosis not present

## 2023-04-28 DIAGNOSIS — G4733 Obstructive sleep apnea (adult) (pediatric): Secondary | ICD-10-CM | POA: Diagnosis not present

## 2023-04-28 DIAGNOSIS — Z23 Encounter for immunization: Secondary | ICD-10-CM | POA: Diagnosis not present

## 2023-04-28 DIAGNOSIS — E039 Hypothyroidism, unspecified: Secondary | ICD-10-CM | POA: Diagnosis not present

## 2023-04-28 DIAGNOSIS — E782 Mixed hyperlipidemia: Secondary | ICD-10-CM | POA: Diagnosis not present

## 2023-05-06 DIAGNOSIS — Z961 Presence of intraocular lens: Secondary | ICD-10-CM | POA: Diagnosis not present

## 2023-05-06 DIAGNOSIS — H1132 Conjunctival hemorrhage, left eye: Secondary | ICD-10-CM | POA: Diagnosis not present

## 2023-05-27 DIAGNOSIS — L821 Other seborrheic keratosis: Secondary | ICD-10-CM | POA: Diagnosis not present

## 2023-05-27 DIAGNOSIS — C44319 Basal cell carcinoma of skin of other parts of face: Secondary | ICD-10-CM | POA: Diagnosis not present

## 2023-06-04 DIAGNOSIS — Z45010 Encounter for checking and testing of cardiac pacemaker pulse generator [battery]: Secondary | ICD-10-CM | POA: Diagnosis not present

## 2023-06-05 DIAGNOSIS — H26493 Other secondary cataract, bilateral: Secondary | ICD-10-CM | POA: Diagnosis not present

## 2023-06-05 DIAGNOSIS — Z961 Presence of intraocular lens: Secondary | ICD-10-CM | POA: Diagnosis not present

## 2023-07-17 DIAGNOSIS — H26492 Other secondary cataract, left eye: Secondary | ICD-10-CM | POA: Diagnosis not present

## 2023-07-17 DIAGNOSIS — Z961 Presence of intraocular lens: Secondary | ICD-10-CM | POA: Diagnosis not present

## 2023-07-30 DIAGNOSIS — G4733 Obstructive sleep apnea (adult) (pediatric): Secondary | ICD-10-CM | POA: Diagnosis not present

## 2023-07-30 DIAGNOSIS — Z950 Presence of cardiac pacemaker: Secondary | ICD-10-CM | POA: Diagnosis not present

## 2023-07-30 DIAGNOSIS — I272 Pulmonary hypertension, unspecified: Secondary | ICD-10-CM | POA: Diagnosis not present

## 2023-07-30 DIAGNOSIS — Z7901 Long term (current) use of anticoagulants: Secondary | ICD-10-CM | POA: Diagnosis not present

## 2023-07-30 DIAGNOSIS — I340 Nonrheumatic mitral (valve) insufficiency: Secondary | ICD-10-CM | POA: Diagnosis not present

## 2023-07-30 DIAGNOSIS — I495 Sick sinus syndrome: Secondary | ICD-10-CM | POA: Diagnosis not present

## 2023-07-30 DIAGNOSIS — I4821 Permanent atrial fibrillation: Secondary | ICD-10-CM | POA: Diagnosis not present

## 2023-07-30 DIAGNOSIS — I341 Nonrheumatic mitral (valve) prolapse: Secondary | ICD-10-CM | POA: Diagnosis not present

## 2023-08-06 DIAGNOSIS — H26491 Other secondary cataract, right eye: Secondary | ICD-10-CM | POA: Diagnosis not present

## 2023-09-01 DIAGNOSIS — I480 Paroxysmal atrial fibrillation: Secondary | ICD-10-CM | POA: Diagnosis not present

## 2023-09-01 DIAGNOSIS — Z79899 Other long term (current) drug therapy: Secondary | ICD-10-CM | POA: Diagnosis not present

## 2023-09-01 DIAGNOSIS — E1169 Type 2 diabetes mellitus with other specified complication: Secondary | ICD-10-CM | POA: Diagnosis not present

## 2023-09-01 DIAGNOSIS — E039 Hypothyroidism, unspecified: Secondary | ICD-10-CM | POA: Diagnosis not present

## 2023-09-01 DIAGNOSIS — E782 Mixed hyperlipidemia: Secondary | ICD-10-CM | POA: Diagnosis not present

## 2023-09-01 DIAGNOSIS — G4733 Obstructive sleep apnea (adult) (pediatric): Secondary | ICD-10-CM | POA: Diagnosis not present

## 2023-09-23 DIAGNOSIS — I495 Sick sinus syndrome: Secondary | ICD-10-CM | POA: Diagnosis not present

## 2023-09-23 DIAGNOSIS — E032 Hypothyroidism due to medicaments and other exogenous substances: Secondary | ICD-10-CM | POA: Diagnosis not present

## 2023-09-23 DIAGNOSIS — R001 Bradycardia, unspecified: Secondary | ICD-10-CM | POA: Diagnosis not present

## 2023-09-23 DIAGNOSIS — Z950 Presence of cardiac pacemaker: Secondary | ICD-10-CM | POA: Diagnosis not present

## 2023-09-23 DIAGNOSIS — I340 Nonrheumatic mitral (valve) insufficiency: Secondary | ICD-10-CM | POA: Diagnosis not present

## 2023-09-23 DIAGNOSIS — I4821 Permanent atrial fibrillation: Secondary | ICD-10-CM | POA: Diagnosis not present

## 2023-09-23 DIAGNOSIS — I341 Nonrheumatic mitral (valve) prolapse: Secondary | ICD-10-CM | POA: Diagnosis not present

## 2023-09-24 DIAGNOSIS — I498 Other specified cardiac arrhythmias: Secondary | ICD-10-CM | POA: Diagnosis not present

## 2023-09-30 DIAGNOSIS — B079 Viral wart, unspecified: Secondary | ICD-10-CM | POA: Diagnosis not present

## 2023-09-30 DIAGNOSIS — C44319 Basal cell carcinoma of skin of other parts of face: Secondary | ICD-10-CM | POA: Diagnosis not present
